# Patient Record
Sex: Female | Born: 1970 | Race: Black or African American | Hispanic: No | Marital: Married | Smoking: Never smoker
Health system: Southern US, Community
[De-identification: ages and names within clinical notes are randomized; demographics above are authoritative.]

## PROBLEM LIST (undated history)

## (undated) DIAGNOSIS — J45909 Unspecified asthma, uncomplicated: Secondary | ICD-10-CM

---

## 2000-02-14 ENCOUNTER — Inpatient Hospital Stay (HOSPITAL_COMMUNITY): Admission: AD | Admit: 2000-02-14 | Discharge: 2000-02-14 | Payer: Self-pay | Admitting: Obstetrics and Gynecology

## 2000-02-15 ENCOUNTER — Inpatient Hospital Stay (HOSPITAL_COMMUNITY): Admission: AD | Admit: 2000-02-15 | Discharge: 2000-02-17 | Payer: Self-pay | Admitting: Obstetrics and Gynecology

## 2003-07-30 ENCOUNTER — Emergency Department (HOSPITAL_COMMUNITY): Admission: EM | Admit: 2003-07-30 | Discharge: 2003-07-30 | Payer: Self-pay | Admitting: Emergency Medicine

## 2003-07-30 ENCOUNTER — Encounter: Payer: Self-pay | Admitting: Emergency Medicine

## 2019-12-02 ENCOUNTER — Emergency Department (HOSPITAL_COMMUNITY)
Admission: EM | Admit: 2019-12-02 | Discharge: 2019-12-03 | Disposition: A | Discharge status: Home / Self Care | Payer: Self-pay | Attending: Emergency Medicine | Admitting: Emergency Medicine

## 2019-12-02 ENCOUNTER — Other Ambulatory Visit: Payer: Self-pay

## 2019-12-02 DIAGNOSIS — F12922 Cannabis use, unspecified with intoxication with perceptual disturbance: Secondary | ICD-10-CM | POA: Insufficient documentation

## 2019-12-02 NOTE — ED Triage Notes (Signed)
Pt arrives via [facility] who found patient with AMS at home. Pt reports taking 1 edible brownie. VS stable enroute. Pt AOx4  Enroute.

## 2019-12-03 NOTE — ED Provider Notes (Signed)
[facility]ENC[facility] DEPARTMENT Provider Note   CSN: [id number] Arrival date & time: 12/02/19  2345     History Chief Complaint  Patient presents with  . Altered Mental Status    Dawn Thornton is a 48 y.o. female.  HPI     This is a 49 year old female with no reported past medical history who presents with altered mental status.  Per EMS, she was found at home altered.  She ate 1 edible marijuana brownie.  On my evaluation, she is euphoric.  She states "I am in praise and worship."  She has no physical complaints.  She does admit to eating 1 edible marijuana and states that she does not normally do any drugs.  She denies any other drug or alcohol use tonight.  She denies chest pain, shortness breath, abdominal pain, nausea, vomiting.  No past medical history on file.  There are no problems to display for this patient.  No reported PMH  OB History   No obstetric history on file.     No family history on file.  Social History   Tobacco Use  . Smoking status: Not on file  Substance Use Topics  . Alcohol use: Not on file  . Drug use: Not on file    Home Medications Prior to Admission medications   Not on File    Allergies    Penicillins  Review of Systems   Review of Systems  Constitutional: Negative for fever.  Respiratory: Negative for shortness of breath.   Cardiovascular: Negative for chest pain.  Gastrointestinal: Negative for abdominal pain and nausea.  Genitourinary: Negative for dysuria.  Psychiatric/Behavioral:       Euphoric  All other systems reviewed and are negative.   Physical Exam Updated Vital Signs BP 121/62   Pulse 76   Temp 97.6 F (36.4 C) (Oral)   Resp 12   SpO2 99%   Physical Exam Vitals and nursing note reviewed.  Constitutional:      Appearance: She is well-developed. She is not ill-appearing.     Comments: ABCs intact  HENT:     Head: Normocephalic and atraumatic.     Nose: Nose normal.   Mouth/Throat:     Mouth: Mucous membranes are moist.  Eyes:     Pupils: Pupils are equal, round, and reactive to light.  Cardiovascular:     Rate and Rhythm: Normal rate and regular rhythm.     Heart sounds: Normal heart sounds.  Pulmonary:     Effort: Pulmonary effort is normal. No respiratory distress.     Breath sounds: No wheezing.  Abdominal:     Palpations: Abdomen is soft.     Tenderness: There is no abdominal tenderness.  Musculoskeletal:     Cervical back: Neck supple.     Right lower leg: No edema.     Left lower leg: No edema.  Skin:    General: Skin is warm and dry.  Neurological:     Mental Status: She is alert and oriented to person, place, and time.     Comments: Follows commands  Psychiatric:     Comments: Euphoric     ED Results / Procedures / Treatments   Labs (all labs ordered are listed, but only abnormal results are displayed) Labs Reviewed - No data to display  EKG EKG Interpretation  Date/Time:  Sunday December 03 2019 00:12:49 EST Ventricular Rate:  78 PR Interval:    QRS Duration: 96 QT Interval:  391 QTC  Calculation: 446 R Axis:   73 Text Interpretation: Sinus rhythm Probable left atrial enlargement RSR' in V1 or V2, right VCD or RVH Confirmed by Ross Marcus ([id number]) on 12/03/2019 12:19:43 AM   Radiology No results found.  Procedures Procedures (including critical care time)  Medications Ordered in ED Medications - No data to display  ED Course  I have reviewed the triage vital signs and the nursing notes.  Pertinent labs & imaging results that were available during my care of the patient were reviewed by me and considered in my medical decision making (see chart for details).    MDM Rules/Calculators/A&P                      Patient presents after eating a marijuana edible.  She is euphoric and pleasant on exam.  Her vital signs are reassuring.  She denies any other illicit drug use.  She has no concerning physical exam  findings.  EKG is without ischemia or arrhythmia.  Patient monitored for a brief period of time.  She was able to ambulate independently and eat.  Son is in the waiting room.  Will discharge her home without further work-up.  After history, exam, and medical workup I feel the patient has been appropriately medically screened and is safe for discharge home. Pertinent diagnoses were discussed with the patient. Patient was given return precautions.   Final Clinical Impression(s) / ED Diagnoses Final diagnoses:  Marijuana intoxication, with perceptual disturbance [facility])    Rx / DC Orders ED Discharge Orders    None       , Mayer Masker, MD 12/03/19 [phone]

## 2019-12-03 NOTE — Discharge Instructions (Addendum)
You were seen today after eating a marijuana edible.  This made you euphoric.  Make sure that you avoid illicit drug use.

## 2020-10-13 ENCOUNTER — Ambulatory Visit (INDEPENDENT_AMBULATORY_CARE_PROVIDER_SITE_OTHER)

## 2020-10-13 ENCOUNTER — Ambulatory Visit: Admission: EM | Admit: 2020-10-13 | Discharge: 2020-10-13 | Disposition: A | Discharge status: Home / Self Care

## 2020-10-13 ENCOUNTER — Other Ambulatory Visit: Payer: Self-pay

## 2020-10-13 ENCOUNTER — Encounter: Payer: Self-pay | Admitting: Emergency Medicine

## 2020-10-13 DIAGNOSIS — R0989 Other specified symptoms and signs involving the circulatory and respiratory systems: Secondary | ICD-10-CM | POA: Diagnosis not present

## 2020-10-13 DIAGNOSIS — J209 Acute bronchitis, unspecified: Secondary | ICD-10-CM | POA: Diagnosis not present

## 2020-10-13 DIAGNOSIS — R059 Cough, unspecified: Secondary | ICD-10-CM

## 2020-10-13 DIAGNOSIS — J22 Unspecified acute lower respiratory infection: Secondary | ICD-10-CM | POA: Diagnosis not present

## 2020-10-13 DIAGNOSIS — Z20822 Contact with and (suspected) exposure to covid-19: Secondary | ICD-10-CM

## 2020-10-13 HISTORY — DX: Unspecified asthma, uncomplicated: J45.909

## 2020-10-13 MED ORDER — DM-GUAIFENESIN ER 30-600 MG PO TB12: 1.0000 | ORAL_TABLET | Freq: Two times a day (BID) | ORAL | 0 refills | Status: AC

## 2020-10-13 MED ORDER — PREDNISONE 50 MG PO TABS: 50.0000 mg | ORAL_TABLET | Freq: Every day | ORAL | 0 refills | Status: AC

## 2020-10-13 MED ORDER — AZITHROMYCIN 200 MG/5ML PO SUSR: 200.0000 mg | Freq: Every day | ORAL | 0 refills | Status: DC

## 2020-10-13 MED ORDER — BENZONATATE 200 MG PO CAPS: 200.0000 mg | ORAL_CAPSULE | Freq: Three times a day (TID) | ORAL | 0 refills | Status: AC | PRN

## 2020-10-13 MED ORDER — NEBULIZER SYSTEM ALL-IN-ONE MISC: 1.0000 | Freq: Once | 0 refills | Status: AC

## 2020-10-13 MED ORDER — IPRATROPIUM-ALBUTEROL 0.5-2.5 (3) MG/3ML IN SOLN: 3.0000 mL | RESPIRATORY_TRACT | 0 refills | Status: DC | PRN

## 2020-10-13 MED ORDER — AZITHROMYCIN 250 MG PO TABS: ORAL_TABLET | ORAL | 0 refills | Status: DC

## 2020-10-13 MED ORDER — CEFPODOXIME PROXETIL 200 MG PO TABS: 200.0000 mg | ORAL_TABLET | Freq: Two times a day (BID) | ORAL | 0 refills | Status: AC

## 2020-10-13 MED ORDER — DEXAMETHASONE 10 MG/ML FOR PEDIATRIC ORAL USE
10.0000 mg | Freq: Once | INTRAMUSCULAR | Status: AC
  Administered 2020-10-13: 10 mg via ORAL

## 2020-10-13 MED ORDER — ALBUTEROL SULFATE HFA 108 (90 BASE) MCG/ACT IN AERS: 1.0000 | INHALATION_SPRAY | Freq: Four times a day (QID) | RESPIRATORY_TRACT | 0 refills | Status: DC | PRN

## 2020-10-13 NOTE — ED Triage Notes (Signed)
Pt here for cough and congestion x 5 days 

## 2020-10-13 NOTE — ED Provider Notes (Signed)
[name]    CSN: [id number] Arrival date & time: 10/13/20  1401      History   Chief Complaint Chief Complaint  Patient presents with  . Cough    HPI Dawn Thornton is a 50 y.o. female presenting today for evaluation of chest congestion.  Reports over the past 5 days has had increased shortness of breath chest tightness as well as some wheezing.  Using albuterol inhaler at home with some relief.  Reports that she has had discomfort in bilateral upper back areas related to deep breathing.  She has felt very fatigued.  Reports history of recurrent bronchitis especially with season changes.  She denies any fevers.  Denies any significant sinus symptoms/URI symptoms.  Requesting nebulizer.    HPI  Past Medical History:  Diagnosis Date  . Asthma     There are no problems to display for this patient.   History reviewed. No pertinent surgical history.  OB History   No obstetric history on file.      Home Medications    Prior to Admission medications   Medication Sig Start Date End Date Taking? Authorizing Provider  albuterol (VENTOLIN HFA) 108 (90 Base) MCG/ACT inhaler Inhale 1-2 puffs into the lungs every 6 (six) hours as needed for wheezing or shortness of breath. 10/13/20  Yes Zanobia Griebel C, PA-C  azithromycin (ZITHROMAX) 250 MG tablet Take first 2 tablets together, then 1 every day until finished. 10/13/20  Yes Omero Kowal C, PA-C  benzonatate (TESSALON) 200 MG capsule Take 1 capsule (200 mg total) by mouth 3 (three) times daily as needed for up to 7 days for cough. 10/13/20 10/20/20 Yes Danie Hannig C, PA-C  cefpodoxime (VANTIN) 200 MG tablet Take 1 tablet (200 mg total) by mouth 2 (two) times daily for 7 days. 10/13/20 10/20/20 Yes Sesilia Poucher C, PA-C  dextromethorphan-guaiFENesin (MUCINEX DM) 30-600 MG 12hr tablet Take 1 tablet by mouth 2 (two) times daily. 10/13/20  Yes Rami Waddle C, PA-C  ipratropium-albuterol (DUONEB) 0.5-2.5 (3) MG/3ML SOLN  Take 3 mLs by nebulization every 4 (four) hours as needed. 10/13/20  Yes October Peery C, PA-C  Nebulizer System All-In-One MISC 1 each by Does not apply route once for 1 dose. 10/13/20 10/13/20 Yes Nissa Stannard C, PA-C  predniSONE (DELTASONE) 50 MG tablet Take 1 tablet (50 mg total) by mouth daily with breakfast for 5 days. 10/13/20 10/18/20 Yes Aahna Rossa, Junius Creamer, PA-C    Family History History reviewed. No pertinent family history.  Social History Social History   Tobacco Use  . Smoking status: Never Smoker  . Smokeless tobacco: Never Used  Substance Use Topics  . Alcohol use: Not Currently  . Drug use: Never     Allergies   Penicillins   Review of Systems Review of Systems  Constitutional: Positive for fatigue. Negative for activity change, appetite change, chills and fever.  HENT: Positive for congestion. Negative for ear pain, rhinorrhea, sinus pressure, sore throat and trouble swallowing.   Eyes: Negative for discharge and redness.  Respiratory: Positive for cough, chest tightness, shortness of breath and wheezing.   Cardiovascular: Negative for chest pain.  Gastrointestinal: Negative for abdominal pain, diarrhea, nausea and vomiting.  Musculoskeletal: Negative for myalgias.  Skin: Negative for rash.  Neurological: Negative for dizziness, light-headedness and headaches.     Physical Exam Triage Vital Signs ED Triage Vitals  Enc Vitals Group     BP      Pulse      Resp  Temp      Temp src      SpO2      Weight      Height      Head Circumference      Peak Flow      Pain Score      Pain Loc      Pain Edu?      Excl. in [place]?    No data found.  Updated Vital Signs BP 119/78 (BP Location: Right Arm)   Pulse 100   Temp 99.3 F (37.4 C) (Oral)   Resp 18   SpO2 99%   Visual Acuity Right Eye Distance:   Left Eye Distance:   Bilateral Distance:    Right Eye Near:   Left Eye Near:    Bilateral Near:     Physical Exam Vitals and nursing note  reviewed.  Constitutional:      Appearance: She is well-developed and well-nourished.     Comments: No acute distress  HENT:     Head: Normocephalic and atraumatic.     Ears:     Comments: Bilateral ears without tenderness to palpation of external auricle, tragus and mastoid, EAC's without erythema or swelling, TM's with good bony landmarks and cone of light. Non erythematous.     Nose: Nose normal.     Mouth/Throat:     Comments: Oral mucosa pink and moist, no tonsillar enlargement or exudate. Posterior pharynx patent and nonerythematous, no uvula deviation or swelling. Normal phonation. Eyes:     Conjunctiva/sclera: Conjunctivae normal.  Cardiovascular:     Rate and Rhythm: Normal rate.  Pulmonary:     Effort: Pulmonary effort is normal. No respiratory distress.     Comments: Breathing comfortably at rest, inspiratory and expiratory wheezing and rhonchi throughout bilateral lung fields, cannot take deep breath without coughing Abdominal:     General: There is no distension.  Musculoskeletal:        General: Normal range of motion.     Cervical back: Neck supple.  Skin:    General: Skin is warm and dry.  Neurological:     Mental Status: She is alert and oriented to person, place, and time.  Psychiatric:        Mood and Affect: Mood and affect normal.      UC Treatments / Results  Labs (all labs ordered are listed, but only abnormal results are displayed) Labs Reviewed  NOVEL CORONAVIRUS, NAA    EKG   Radiology DG Chest 2 View  Result Date: 10/13/2020 CLINICAL DATA:  Cough and congestion. EXAM: CHEST - 2 VIEW COMPARISON:  None. FINDINGS: Infiltrate in left base. Subtle opacity in the periphery of the right base may also be present. The heart, hila, mediastinum, lungs, and pleura are otherwise unremarkable. IMPRESSION: Left basilar infiltrate, consistent with pneumonia. Possible subtle infiltrate in the right base peripherally as well. Electronically Signed   By: Gerome Sam III M.D   On: 10/13/2020 15:30    Procedures Procedures (including critical care time)  Medications Ordered in UC Medications  dexamethasone (DECADRON) 10 MG/ML injection for Pediatric ORAL use 10 mg (has no administration in time range)    Initial Impression / Assessment and Plan / UC Course  I have reviewed the triage vital signs and the nursing notes.  Pertinent labs & imaging results that were available during my care of the patient were reviewed by me and considered in my medical decision making (see chart for details).  Covid test pending X-ray with left basilar infiltrate, possible right, given this possible Covid pneumonia, but opting to go ahead and proceed with treatment for community-acquired pneumonia, as penicillin allergy which is hives only, declines any airway problems with penicillins, will proceed with Cefpodoxime twice daily for [redacted] week along with azithromycin.  Providing 1 dose of Decadron prior to discharge to help with wheezing and inflammation in lungs, continuing on prednisone course x5 days, albuterol inhaler as needed, provided prescription for nebulizer machine to use as needed if she is able to obtain from pharmacy.  Tessalon and Mucinex.  Rest and fluids.  Continue to monitor breathing and symptoms,Discussed strict return precautions. Patient verbalized understanding and is agreeable with plan.  Final Clinical Impressions(s) / UC Diagnoses   Final diagnoses:  Encounter for screening laboratory testing for COVID-19 virus  Acute bronchitis, unspecified organism  Lower respiratory infection (e.g., bronchitis, pneumonia, pneumonitis, pulmonitis)     Discharge Instructions     X-ray shows pneumonia; Covid test pending Begin Cefpodoxime twice daily for 1 week and azithromycin course We gave you a dose of Decadron here, continue with prednisone daily for the next 5 days-take with food and in the morning if you are able Albuterol inhaler nebulizers  as needed for shortness of breath chest tightness and wheezing Tessalon for cough Mucinex DM to further help with congestion and cough Rest and fluids Follow-up if not improving or worsening    ED Prescriptions    Medication Sig Dispense Auth. Provider   Nebulizer System All-In-One MISC 1 each by Does not apply route once for 1 dose. 1 each Larraine Argo C, PA-C   ipratropium-albuterol (DUONEB) 0.5-2.5 (3) MG/3ML SOLN Take 3 mLs by nebulization every 4 (four) hours as needed. 150 mL Johnnae Impastato C, PA-C   cefpodoxime (VANTIN) 200 MG tablet Take 1 tablet (200 mg total) by mouth 2 (two) times daily for 7 days. 14 tablet Hayven Croy C, PA-C   azithromycin (ZITHROMAX) 200 MG/5ML suspension  (Status: Discontinued) Take 5 mLs (200 mg total) by mouth daily. 22.5 mL Damya Comley C, PA-C   predniSONE (DELTASONE) 50 MG tablet Take 1 tablet (50 mg total) by mouth daily with breakfast for 5 days. 5 tablet Neng Albee C, PA-C   albuterol (VENTOLIN HFA) 108 (90 Base) MCG/ACT inhaler Inhale 1-2 puffs into the lungs every 6 (six) hours as needed for wheezing or shortness of breath. 18 g Minie Roadcap C, PA-C   benzonatate (TESSALON) 200 MG capsule Take 1 capsule (200 mg total) by mouth 3 (three) times daily as needed for up to 7 days for cough. 28 capsule Lawren Sexson C, PA-C   dextromethorphan-guaiFENesin (MUCINEX DM) 30-600 MG 12hr tablet Take 1 tablet by mouth 2 (two) times daily. 20 tablet Sana Tessmer C, PA-C   azithromycin (ZITHROMAX) 250 MG tablet Take first 2 tablets together, then 1 every day until finished. 6 tablet Stephen Baruch, [place] C, PA-C     PDMP not reviewed this encounter.   Lew Dawes, [place] 10/13/20 1546

## 2020-10-13 NOTE — Discharge Instructions (Addendum)
X-ray shows pneumonia; Covid test pending Begin Cefpodoxime twice daily for 1 week and azithromycin course We gave you a dose of Decadron here, continue with prednisone daily for the next 5 days-take with food and in the morning if you are able Albuterol inhaler nebulizers as needed for shortness of breath chest tightness and wheezing Tessalon for cough Mucinex DM to further help with congestion and cough Rest and fluids Follow-up if not improving or worsening

## 2020-10-15 LAB — SARS-COV-2, NAA 2 DAY TAT

## 2020-10-15 LAB — NOVEL CORONAVIRUS, NAA: SARS-CoV-2, NAA: DETECTED — AB

## 2020-10-18 ENCOUNTER — Telehealth: Payer: Self-pay

## 2020-10-18 MED ORDER — IPRATROPIUM-ALBUTEROL 0.5-2.5 (3) MG/3ML IN SOLN: 3.0000 mL | RESPIRATORY_TRACT | 0 refills | Status: AC | PRN

## 2022-01-04 IMAGING — DX DG CHEST 2V
2 series · 2 of 2 positions shown · non-contrast
Comparison: None.

CLINICAL DATA: Cough and congestion.

EXAM:
CHEST - 2 VIEW

[chest pa]
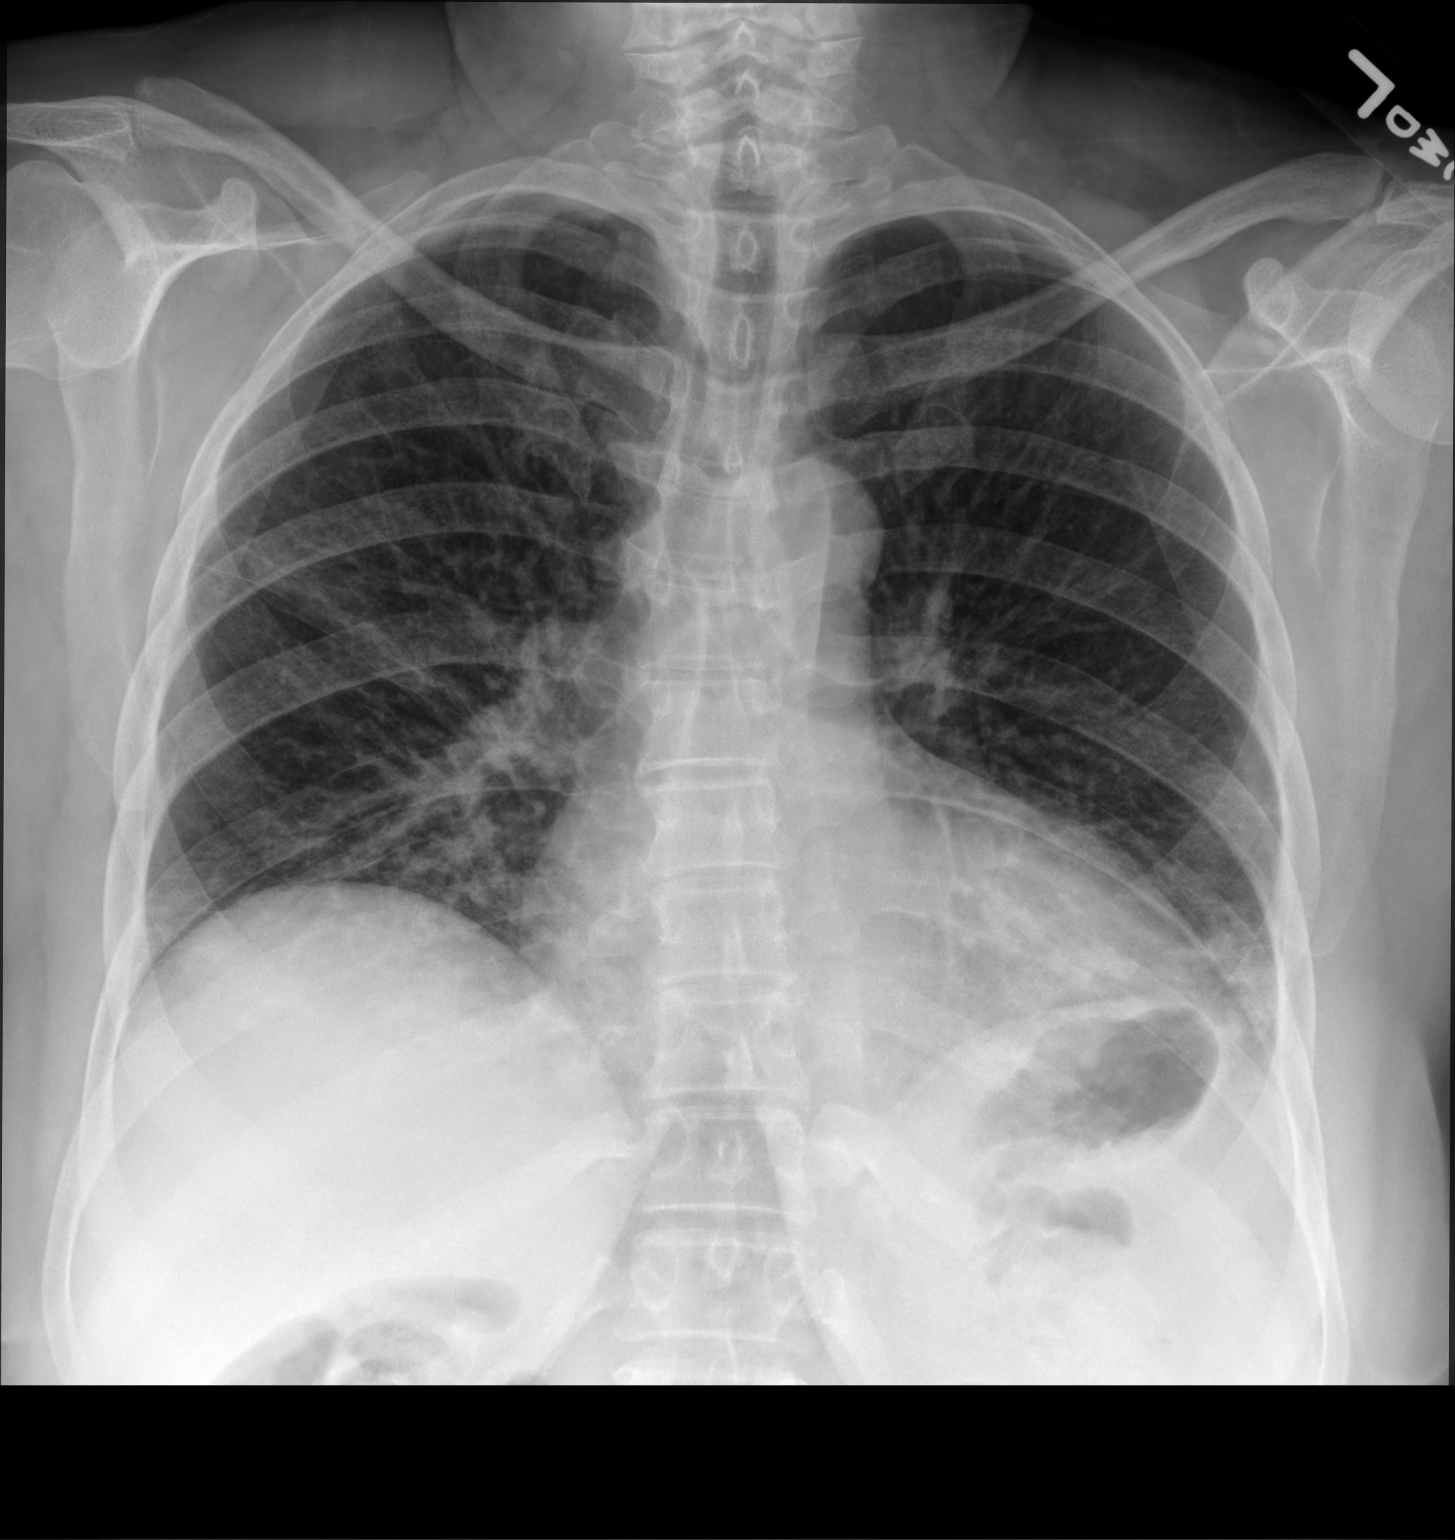

[chest lat]
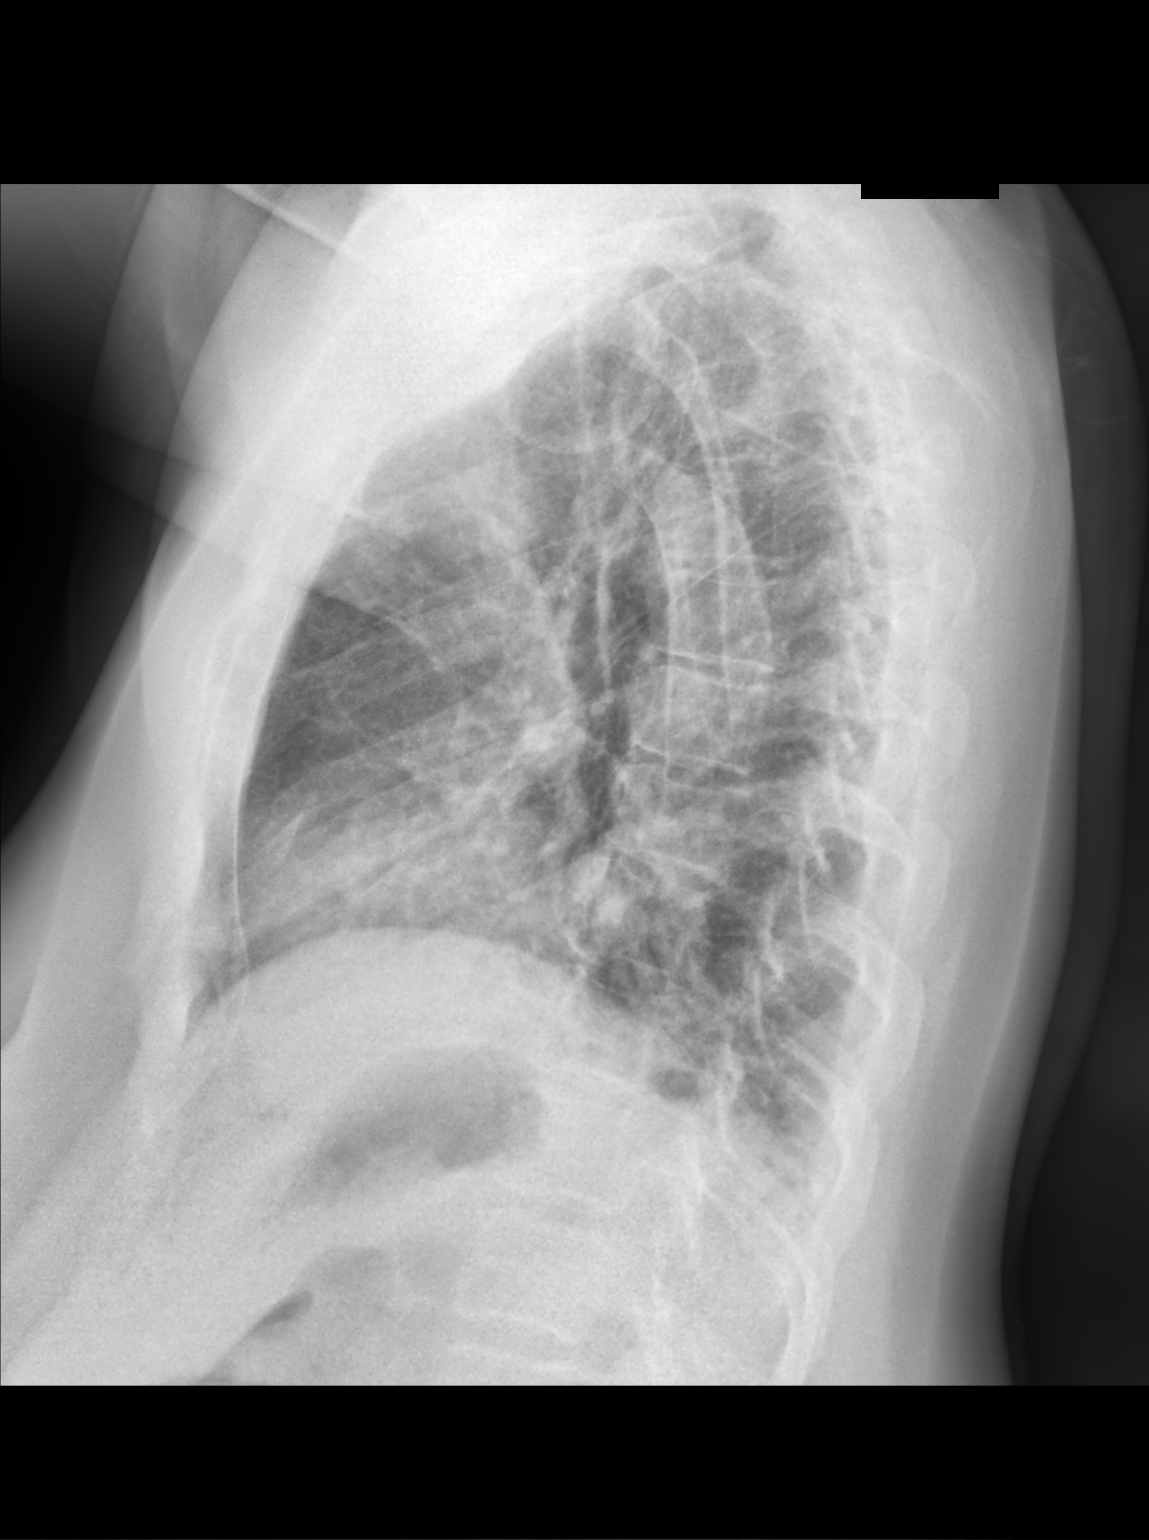

[2 of 2 positions shown; findings below may reference images not displayed]

FINDINGS: Infiltrate in left base. Subtle opacity in the periphery of the
right base may also be present. The heart, hila, mediastinum, lungs,
and pleura are otherwise unremarkable.
IMPRESSION: Left basilar infiltrate, consistent with pneumonia. Possible subtle
infiltrate in the right base peripherally as well.

## 2022-10-29 ENCOUNTER — Ambulatory Visit
Admission: EM | Admit: 2022-10-29 | Discharge: 2022-10-29 | Disposition: A | Discharge status: Home / Self Care | Payer: BC Managed Care – PPO | Attending: Internal Medicine | Admitting: Internal Medicine

## 2022-10-29 ENCOUNTER — Encounter: Payer: Self-pay | Admitting: Emergency Medicine

## 2022-10-29 DIAGNOSIS — J020 Streptococcal pharyngitis: Secondary | ICD-10-CM

## 2022-10-29 LAB — POCT RAPID STREP A (OFFICE): Rapid Strep A Screen: POSITIVE — AB

## 2022-10-29 MED ORDER — ALBUTEROL SULFATE HFA 108 (90 BASE) MCG/ACT IN AERS
1.0000 | INHALATION_SPRAY | Freq: Four times a day (QID) | RESPIRATORY_TRACT | 0 refills | Status: AC | PRN
Start: 2022-10-29 — End: ?

## 2022-10-29 MED ORDER — AZITHROMYCIN 250 MG PO TABS
250.0000 mg | ORAL_TABLET | Freq: Every day | ORAL | 0 refills | Status: AC
Start: 2022-10-29 — End: ?

## 2022-10-29 NOTE — ED Provider Notes (Signed)
[name]    CSN: [id number] Arrival date & time: 10/29/22  4235      History   Chief Complaint Chief Complaint  Patient presents with   Sore Throat   Facial Pain    HPI Dawn Thornton is a 52 y.o. female.   Patient presents today with throat pain nasal congestion with some nasal bleeding intermittently.  Patient has been taking BC powders and Benadryl with no relief.  Denies any abdominal pain no new rashes states she has had some cough and would like for [place] to refill her albuterol inhaler.    Past Medical History:  Diagnosis Date   Asthma     There are no problems to display for this patient.   History reviewed. No pertinent surgical history.  OB History   No obstetric history on file.      Home Medications    Prior to Admission medications   Medication Sig Start Date End Date Taking? Authorizing Provider  azithromycin (ZITHROMAX) 250 MG tablet Take 1 tablet (250 mg total) by mouth daily. Take first 2 tablets together, then 1 every day until finished. 10/29/22  Yes Marney Setting, NP  albuterol (VENTOLIN HFA) 108 (90 Base) MCG/ACT inhaler Inhale 1-2 puffs into the lungs every 6 (six) hours as needed for wheezing or shortness of breath. 10/29/22   Marney Setting, NP  dextromethorphan-guaiFENesin (MUCINEX DM) 30-600 MG 12hr tablet Take 1 tablet by mouth 2 (two) times daily. 10/13/20   Wieters, Hallie C, PA-C  ipratropium-albuterol (DUONEB) 0.5-2.5 (3) MG/3ML SOLN Take 3 mLs by nebulization every 4 (four) hours as needed. 10/18/20   Hall-Potvin, [place], PA-C    Family History History reviewed. No pertinent family history.  Social History Social History   Tobacco Use   Smoking status: Never   Smokeless tobacco: Never  Substance Use Topics   Alcohol use: Not Currently   Drug use: Never     Allergies   Penicillins   Review of Systems Review of Systems  Constitutional:  Positive for chills and fever.  HENT:  Positive for  congestion, postnasal drip, sinus pressure, sinus pain and sore throat.   Eyes: Negative.   Respiratory:  Positive for cough.   Cardiovascular: Negative.   Gastrointestinal: Negative.   Genitourinary: Negative.   Neurological: Negative.      Physical Exam Triage Vital Signs ED Triage Vitals [10/29/22 0951]  Enc Vitals Group     BP (!) 146/95     Pulse Rate (!) 117     Resp 16     Temp 99.5 F (37.5 C)     Temp Source Oral     SpO2 96 %     Weight      Height      Head Circumference      Peak Flow      Pain Score 9     Pain Loc      Pain Edu?      Excl. in [place]?    No data found.  Updated Vital Signs BP (!) 146/95 (BP Location: Left Arm)   Pulse (!) 117   Temp 99.5 F (37.5 C) (Oral)   Resp 16   SpO2 96%   Visual Acuity Right Eye Distance:   Left Eye Distance:   Bilateral Distance:    Right Eye Near:   Left Eye Near:    Bilateral Near:     Physical Exam Constitutional:      Appearance: She is ill-appearing.  HENT:     Nose: Congestion present.     Mouth/Throat:     Mouth: No oral lesions.     Pharynx: Pharyngeal swelling, oropharyngeal exudate, posterior oropharyngeal erythema and uvula swelling present.     Tonsils: Tonsillar exudate and tonsillar abscess present. 2+ on the right. 2+ on the left.  Eyes:     Conjunctiva/sclera: Conjunctivae normal.  Cardiovascular:     Rate and Rhythm: Normal rate.  Pulmonary:     Effort: Pulmonary effort is normal.     Breath sounds: Normal breath sounds.  Abdominal:     Palpations: Abdomen is soft.  Musculoskeletal:     Cervical back: Normal range of motion.  Skin:    General: Skin is warm.     Findings: No rash.  Neurological:     Mental Status: She is alert.      UC Treatments / Results  Labs (all labs ordered are listed, but only abnormal results are displayed) Labs Reviewed  POCT RAPID STREP A (OFFICE) - Abnormal; Notable for the following components:      Result Value   Rapid Strep A Screen  Positive (*)    All other components within normal limits    EKG   Radiology No results found.  Procedures Procedures (including critical care time)  Medications Ordered in UC Medications - No data to display  Initial Impression / Assessment and Plan / UC Course  I have reviewed the triage vital signs and the nursing notes.  Pertinent labs & imaging results that were available during my care of the patient were reviewed by me and considered in my medical decision making (see chart for details).     We will refill your albuterol inhaler to use as needed Take full dose of antibiotics with food If symptoms become worse you need to be seen in the emergency room Take Tylenol or Motrin as needed for pain avoid BC powders  Final Clinical Impressions(s) / UC Diagnoses   Final diagnoses:  Acute streptococcal pharyngitis     Discharge Instructions      Take full dose of antibiotics with food If symptoms become worse you need to be seen in the emergency room Take Tylenol or Motrin as needed for pain avoid BC powders Drop test was positive today     ED Prescriptions     Medication Sig Dispense Auth. Provider   albuterol (VENTOLIN HFA) 108 (90 Base) MCG/ACT inhaler Inhale 1-2 puffs into the lungs every 6 (six) hours as needed for wheezing or shortness of breath. 18 g Morley Kos L, NP   azithromycin (ZITHROMAX) 250 MG tablet Take 1 tablet (250 mg total) by mouth daily. Take first 2 tablets together, then 1 every day until finished. 6 tablet Marney Setting, NP      PDMP not reviewed this encounter.   Marney Setting, NP 10/29/22 1034

## 2022-10-29 NOTE — ED Triage Notes (Addendum)
Fever, sore throat, facial pain, nasal congestion since yesterday. Gargling salt water, drinking herbal tea, and using BC powders and benadryl. 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

## 2022-10-29 NOTE — Discharge Instructions (Addendum)
Take full dose of antibiotics with food If symptoms become worse you need to be seen in the emergency room Take Tylenol or Motrin as needed for pain avoid BC powders Drop test was positive today
